# Patient Record
Sex: Male | Born: 1960 | Race: White | Hispanic: No | Smoking: Current every day smoker
Health system: Southern US, Community
[De-identification: ages and names within clinical notes are randomized; demographics above are authoritative.]

## PROBLEM LIST (undated history)

## (undated) DIAGNOSIS — M549 Dorsalgia, unspecified: Secondary | ICD-10-CM

## (undated) DIAGNOSIS — K219 Gastro-esophageal reflux disease without esophagitis: Secondary | ICD-10-CM

## (undated) DIAGNOSIS — E785 Hyperlipidemia, unspecified: Secondary | ICD-10-CM

## (undated) DIAGNOSIS — E119 Type 2 diabetes mellitus without complications: Secondary | ICD-10-CM

## (undated) DIAGNOSIS — I10 Essential (primary) hypertension: Secondary | ICD-10-CM

---

## 1999-03-25 ENCOUNTER — Emergency Department (HOSPITAL_COMMUNITY): Admission: EM | Admit: 1999-03-25 | Discharge: 1999-03-25 | Payer: Self-pay | Admitting: Emergency Medicine

## 1999-03-25 ENCOUNTER — Encounter: Payer: Self-pay | Admitting: Emergency Medicine

## 2001-04-29 ENCOUNTER — Ambulatory Visit (HOSPITAL_COMMUNITY): Admission: RE | Admit: 2001-04-29 | Discharge: 2001-04-29 | Payer: Self-pay | Admitting: Pulmonary Disease

## 2001-05-11 ENCOUNTER — Ambulatory Visit (HOSPITAL_COMMUNITY): Admission: RE | Admit: 2001-05-11 | Discharge: 2001-05-11 | Payer: Self-pay | Admitting: Pulmonary Disease

## 2001-05-20 ENCOUNTER — Emergency Department (HOSPITAL_COMMUNITY): Admission: EM | Admit: 2001-05-20 | Discharge: 2001-05-21 | Payer: Self-pay | Admitting: Emergency Medicine

## 2001-05-21 ENCOUNTER — Encounter: Payer: Self-pay | Admitting: Emergency Medicine

## 2009-10-06 ENCOUNTER — Emergency Department (HOSPITAL_COMMUNITY): Admission: EM | Admit: 2009-10-06 | Discharge: 2009-10-06 | Payer: Self-pay | Admitting: Emergency Medicine

## 2011-04-04 LAB — CBC
HCT: 44.1 % (ref 39.0–52.0)
Hemoglobin: 15.2 g/dL (ref 13.0–17.0)
MCHC: 34.4 g/dL (ref 30.0–36.0)
MCV: 89.4 fL (ref 78.0–100.0)
Platelets: 231 10*3/uL (ref 150–400)
RBC: 4.93 MIL/uL (ref 4.22–5.81)
RDW: 13.9 % (ref 11.5–15.5)
WBC: 11.4 10*3/uL — ABNORMAL HIGH (ref 4.0–10.5)

## 2011-04-04 LAB — DIFFERENTIAL
Basophils Absolute: 0.1 10*3/uL (ref 0.0–0.1)
Basophils Relative: 1 % (ref 0–1)
Lymphocytes Relative: 41 % (ref 12–46)
Monocytes Relative: 8 % (ref 3–12)
Neutro Abs: 5.4 10*3/uL (ref 1.7–7.7)
Neutrophils Relative %: 47 % (ref 43–77)

## 2011-04-04 LAB — BASIC METABOLIC PANEL
BUN: 12 mg/dL (ref 6–23)
CO2: 26 mEq/L (ref 19–32)
Calcium: 9.4 mg/dL (ref 8.4–10.5)
Chloride: 109 mEq/L (ref 96–112)
Creatinine, Ser: 1.11 mg/dL (ref 0.4–1.5)
GFR calc Af Amer: >60 mL/min (ref >60)
GFR calc non Af Amer: >60 mL/min (ref >60)
Glucose, Bld: 111 mg/dL — ABNORMAL HIGH (ref 70–99)
Potassium: 3.5 mEq/L (ref 3.5–5.1)
Sodium: 139 mEq/L (ref 135–145)

## 2011-04-04 LAB — POCT CARDIAC MARKERS
CKMB, poc: <1 ng/mL — ABNORMAL LOW (ref 1.0–8.0)
Myoglobin, poc: 70.8 ng/mL (ref 12–200)
Myoglobin, poc: 77.2 ng/mL (ref 12–200)
Troponin i, poc: <0.05 ng/mL (ref 0.00–0.09)

## 2011-04-08 ENCOUNTER — Ambulatory Visit (HOSPITAL_COMMUNITY)
Admission: RE | Admit: 2011-04-08 | Discharge: 2011-04-08 | Disposition: A | Discharge status: Home / Self Care | Payer: MEDICARE | Source: Ambulatory Visit | Attending: Neurosurgery | Admitting: Neurosurgery

## 2011-04-08 ENCOUNTER — Ambulatory Visit (HOSPITAL_COMMUNITY): Payer: Self-pay | Admitting: Physical Therapy

## 2011-04-08 DIAGNOSIS — M545 Low back pain, unspecified: Secondary | ICD-10-CM | POA: Insufficient documentation

## 2011-04-08 DIAGNOSIS — R269 Unspecified abnormalities of gait and mobility: Secondary | ICD-10-CM | POA: Insufficient documentation

## 2011-04-08 DIAGNOSIS — IMO0001 Reserved for inherently not codable concepts without codable children: Secondary | ICD-10-CM | POA: Insufficient documentation

## 2011-04-08 DIAGNOSIS — M6281 Muscle weakness (generalized): Secondary | ICD-10-CM | POA: Insufficient documentation

## 2011-04-08 DIAGNOSIS — R209 Unspecified disturbances of skin sensation: Secondary | ICD-10-CM | POA: Insufficient documentation

## 2011-04-10 ENCOUNTER — Ambulatory Visit (HOSPITAL_COMMUNITY): Payer: MEDICARE

## 2011-04-15 ENCOUNTER — Ambulatory Visit (HOSPITAL_COMMUNITY)
Admission: RE | Admit: 2011-04-15 | Discharge: 2011-04-15 | Disposition: A | Discharge status: Home / Self Care | Payer: MEDICARE | Source: Ambulatory Visit | Attending: Family Medicine | Admitting: Family Medicine

## 2011-04-17 ENCOUNTER — Ambulatory Visit (HOSPITAL_COMMUNITY): Payer: MEDICARE

## 2011-04-22 ENCOUNTER — Ambulatory Visit (HOSPITAL_COMMUNITY)
Admission: RE | Admit: 2011-04-22 | Discharge: 2011-04-22 | Disposition: A | Discharge status: Home / Self Care | Payer: MEDICARE | Source: Ambulatory Visit | Attending: Family Medicine | Admitting: Family Medicine

## 2011-04-26 ENCOUNTER — Ambulatory Visit (HOSPITAL_COMMUNITY): Payer: MEDICARE

## 2011-04-29 ENCOUNTER — Ambulatory Visit (HOSPITAL_COMMUNITY)
Admission: RE | Admit: 2011-04-29 | Discharge: 2011-04-29 | Disposition: A | Discharge status: Home / Self Care | Payer: MEDICARE | Source: Ambulatory Visit | Attending: Family Medicine | Admitting: Family Medicine

## 2011-05-02 ENCOUNTER — Ambulatory Visit (HOSPITAL_COMMUNITY)
Admission: RE | Admit: 2011-05-02 | Discharge: 2011-05-02 | Disposition: A | Discharge status: Home / Self Care | Payer: MEDICARE | Source: Ambulatory Visit | Attending: Neurosurgery | Admitting: Neurosurgery

## 2011-05-02 DIAGNOSIS — IMO0001 Reserved for inherently not codable concepts without codable children: Secondary | ICD-10-CM | POA: Insufficient documentation

## 2011-05-02 DIAGNOSIS — M545 Low back pain, unspecified: Secondary | ICD-10-CM | POA: Insufficient documentation

## 2011-05-02 DIAGNOSIS — R209 Unspecified disturbances of skin sensation: Secondary | ICD-10-CM | POA: Insufficient documentation

## 2011-05-02 DIAGNOSIS — R269 Unspecified abnormalities of gait and mobility: Secondary | ICD-10-CM | POA: Insufficient documentation

## 2011-05-02 DIAGNOSIS — M6281 Muscle weakness (generalized): Secondary | ICD-10-CM | POA: Insufficient documentation

## 2011-05-06 ENCOUNTER — Ambulatory Visit (HOSPITAL_COMMUNITY)
Admission: RE | Admit: 2011-05-06 | Discharge: 2011-05-06 | Disposition: A | Discharge status: Home / Self Care | Payer: MEDICARE | Source: Ambulatory Visit | Attending: Family Medicine | Admitting: Family Medicine

## 2011-05-09 ENCOUNTER — Ambulatory Visit (HOSPITAL_COMMUNITY)
Admission: RE | Admit: 2011-05-09 | Discharge: 2011-05-09 | Disposition: A | Discharge status: Home / Self Care | Payer: MEDICARE | Source: Ambulatory Visit | Attending: Family Medicine | Admitting: Family Medicine

## 2011-05-13 ENCOUNTER — Ambulatory Visit (HOSPITAL_COMMUNITY)
Admission: RE | Admit: 2011-05-13 | Discharge: 2011-05-13 | Disposition: A | Discharge status: Home / Self Care | Payer: MEDICARE | Source: Ambulatory Visit | Attending: Family Medicine | Admitting: Family Medicine

## 2011-05-16 ENCOUNTER — Ambulatory Visit (HOSPITAL_COMMUNITY)
Admission: RE | Admit: 2011-05-16 | Discharge: 2011-05-16 | Disposition: A | Discharge status: Home / Self Care | Payer: MEDICARE | Source: Ambulatory Visit | Attending: Family Medicine | Admitting: Family Medicine

## 2011-05-20 ENCOUNTER — Ambulatory Visit (HOSPITAL_COMMUNITY)
Admission: RE | Admit: 2011-05-20 | Discharge: 2011-05-20 | Disposition: A | Discharge status: Home / Self Care | Payer: MEDICARE | Source: Ambulatory Visit | Attending: Family Medicine | Admitting: Family Medicine

## 2011-05-22 ENCOUNTER — Ambulatory Visit (HOSPITAL_COMMUNITY): Payer: MEDICARE | Admitting: Physical Therapy

## 2011-05-28 ENCOUNTER — Ambulatory Visit (HOSPITAL_COMMUNITY)
Admission: RE | Admit: 2011-05-28 | Discharge: 2011-05-28 | Disposition: A | Discharge status: Home / Self Care | Payer: MEDICARE | Source: Ambulatory Visit | Attending: Family Medicine | Admitting: Family Medicine

## 2011-05-30 ENCOUNTER — Ambulatory Visit (HOSPITAL_COMMUNITY): Payer: MEDICARE | Admitting: Physical Therapy

## 2011-06-03 ENCOUNTER — Ambulatory Visit (HOSPITAL_COMMUNITY): Payer: Medicare Other

## 2011-06-06 ENCOUNTER — Ambulatory Visit (HOSPITAL_COMMUNITY): Payer: Medicare Other | Admitting: *Deleted

## 2011-06-17 ENCOUNTER — Ambulatory Visit (HOSPITAL_COMMUNITY): Payer: Medicare Other | Admitting: *Deleted

## 2011-06-20 ENCOUNTER — Ambulatory Visit (HOSPITAL_COMMUNITY): Payer: Medicare Other | Admitting: *Deleted

## 2012-07-08 ENCOUNTER — Encounter (INDEPENDENT_AMBULATORY_CARE_PROVIDER_SITE_OTHER): Payer: Self-pay | Admitting: *Deleted

## 2012-07-23 ENCOUNTER — Encounter (INDEPENDENT_AMBULATORY_CARE_PROVIDER_SITE_OTHER): Payer: Medicare Other | Admitting: Internal Medicine

## 2012-07-23 ENCOUNTER — Encounter (INDEPENDENT_AMBULATORY_CARE_PROVIDER_SITE_OTHER): Payer: Self-pay | Admitting: Internal Medicine

## 2012-07-23 VITALS — BP 110/70 | HR 80 | Temp 98.2°F | Ht 71.0 in | Wt 214.3 lb

## 2012-07-23 DIAGNOSIS — K219 Gastro-esophageal reflux disease without esophagitis: Secondary | ICD-10-CM | POA: Insufficient documentation

## 2012-07-23 NOTE — Progress Notes (Signed)
Subjective:     Patient ID: Derek Hanson, male   DOB: 1961-07-10, 51 y.o.   MRN: [id number]  HPI Referred   Review of Systems     Objective:   Physical Exam     Assessment:        Plan:           This encounter was created in error - please disregard.

## 2012-08-09 ENCOUNTER — Emergency Department (HOSPITAL_COMMUNITY): Payer: Medicare Other

## 2012-08-09 ENCOUNTER — Inpatient Hospital Stay (HOSPITAL_COMMUNITY)
Admission: EM | Admit: 2012-08-09 | Discharge: 2012-08-09 | DRG: 065 | Discharge status: Against Medical Advice | Payer: Medicare Other | Attending: Emergency Medicine | Admitting: Emergency Medicine

## 2012-08-09 ENCOUNTER — Encounter (HOSPITAL_COMMUNITY): Payer: Self-pay | Admitting: *Deleted

## 2012-08-09 DIAGNOSIS — G8190 Hemiplegia, unspecified affecting unspecified side: Secondary | ICD-10-CM | POA: Diagnosis present

## 2012-08-09 DIAGNOSIS — K219 Gastro-esophageal reflux disease without esophagitis: Secondary | ICD-10-CM | POA: Diagnosis present

## 2012-08-09 DIAGNOSIS — I6350 Cerebral infarction due to unspecified occlusion or stenosis of unspecified cerebral artery: Principal | ICD-10-CM | POA: Diagnosis present

## 2012-08-09 DIAGNOSIS — Z79899 Other long term (current) drug therapy: Secondary | ICD-10-CM

## 2012-08-09 DIAGNOSIS — I639 Cerebral infarction, unspecified: Secondary | ICD-10-CM

## 2012-08-09 DIAGNOSIS — R079 Chest pain, unspecified: Secondary | ICD-10-CM | POA: Diagnosis present

## 2012-08-09 DIAGNOSIS — F17200 Nicotine dependence, unspecified, uncomplicated: Secondary | ICD-10-CM | POA: Diagnosis present

## 2012-08-09 HISTORY — DX: Gastro-esophageal reflux disease without esophagitis: K21.9

## 2012-08-09 HISTORY — DX: Dorsalgia, unspecified: M54.9

## 2012-08-09 LAB — COMPREHENSIVE METABOLIC PANEL
ALT: 14 U/L (ref 0–53)
AST: 14 U/L (ref 0–37)
Albumin: 3.6 g/dL (ref 3.5–5.2)
CO2: 24 mEq/L (ref 19–32)
Chloride: 102 mEq/L (ref 96–112)
GFR calc non Af Amer: 75 mL/min — ABNORMAL LOW (ref >90)
Potassium: 3.3 mEq/L — ABNORMAL LOW (ref 3.5–5.1)
Sodium: 137 mEq/L (ref 135–145)
Total Bilirubin: 0.2 mg/dL — ABNORMAL LOW (ref 0.3–1.2)

## 2012-08-09 LAB — TROPONIN I: Troponin I: <0.3 ng/mL (ref <0.30)

## 2012-08-09 LAB — CBC WITH DIFFERENTIAL/PLATELET
Basophils Absolute: 0.1 10*3/uL (ref 0.0–0.1)
Basophils Relative: 1 % (ref 0–1)
Hemoglobin: 14.3 g/dL (ref 13.0–17.0)
Lymphocytes Relative: 40 % (ref 12–46)
MCHC: 33.6 g/dL (ref 30.0–36.0)
MCV: 89.3 fL (ref 78.0–100.0)
Monocytes Absolute: 0.6 10*3/uL (ref 0.1–1.0)
Neutro Abs: 4.7 10*3/uL (ref 1.7–7.7)
Neutrophils Relative %: 49 % (ref 43–77)
Platelets: 231 10*3/uL (ref 150–400)
RBC: 4.77 MIL/uL (ref 4.22–5.81)
RDW: 14.1 % (ref 11.5–15.5)

## 2012-08-09 LAB — PROTIME-INR: INR: 0.85 (ref 0.00–1.49)

## 2012-08-09 LAB — GLUCOSE, CAPILLARY: Glucose-Capillary: 103 mg/dL — ABNORMAL HIGH (ref 70–99)

## 2012-08-09 MED ORDER — ASPIRIN 81 MG PO CHEW
324.0000 mg | CHEWABLE_TABLET | Freq: Once | ORAL | Status: AC
  Administered 2012-08-09: 324 mg via ORAL
  Filled 2012-08-09: qty 4

## 2012-08-09 MED ORDER — PANTOPRAZOLE SODIUM 40 MG PO TBEC
80.0000 mg | DELAYED_RELEASE_TABLET | Freq: Every day | ORAL | Status: DC
  Filled 2012-08-09: qty 2

## 2012-08-09 MED ORDER — NITROGLYCERIN 0.4 MG SL SUBL
0.4000 mg | SUBLINGUAL_TABLET | Freq: Once | SUBLINGUAL | Status: AC
  Administered 2012-08-09: 0.4 mg via SUBLINGUAL
  Filled 2012-08-09: qty 75

## 2012-08-09 MED ORDER — NITROGLYCERIN 0.4 MG SL SUBL: 0.4000 mg | SUBLINGUAL_TABLET | SUBLINGUAL | Status: DC | PRN

## 2012-08-09 MED ORDER — ONDANSETRON HCL 4 MG/2ML IJ SOLN
4.0000 mg | Freq: Once | INTRAMUSCULAR | Status: AC
  Administered 2012-08-09: 4 mg via INTRAVENOUS
  Filled 2012-08-09: qty 2

## 2012-08-09 NOTE — ED Notes (Signed)
Family at bedside. 

## 2012-08-09 NOTE — ED Notes (Signed)
Dr Preston Fleeting went in to see patient. Patient then left without signing AMA forms. Pt was told to sign AMA forms prior to leaving but he walked through the back without seeing the RN first.

## 2012-08-09 NOTE — ED Notes (Signed)
RN at bedside

## 2012-08-09 NOTE — ED Provider Notes (Addendum)
History   This chart was scribed for Dione Booze, MD scribed by Magnus Sinning. The patient was seen in room [facility] at 16:30   CSN: [id number]  Arrival date & time 08/09/12  1545   First MD Initiated Contact with Patient 08/09/12 1628      Chief Complaint  Patient presents with  . Chest Pain  . Weakness    (Consider location/radiation/quality/duration/timing/severity/associated sxs/prior treatment) HPI Derek Hanson is a 51 y.o. male who presents to the Emergency Department complaining of constant moderate right sided arm numbness and tingling, onset yesterday with associated right leg swelling, and right leg numbness, both onset approximately 40 minutes ago today. Patient also presents for eval of pressure like chest pain that began yesterday also. He rates a 8/10 for chest pain and denies any aggravating or modifying factors. Says he has had difficulty ambulating, fatigue, diaphoresis, SOB, weakness, and constant nausea that began today. Denies HA, consumption of etoh, but says he currently smokes 1 pack a day. Patient has hx of GERD treated with Nexium, and back pain.   Past Medical History  Diagnosis Date  . Back pain   . GERD (gastroesophageal reflux disease)     History reviewed. No pertinent past surgical history.  No family history on file.  History  Substance Use Topics  . Smoking status: Current Everyday Smoker  . Smokeless tobacco: Not on file   Comment: 1 pack a day.  . Alcohol Use: No      Review of Systems  All other systems reviewed and are negative.    Allergies  Review of patient's allergies indicates no known allergies.  Home Medications   Current Outpatient Rx  Name Route Sig Dispense Refill  . ESOMEPRAZOLE MAGNESIUM 40 MG PO CPDR Oral Take 40 mg by mouth daily before breakfast.      BP 123/76  Pulse 86  Temp 98 F (36.7 C) (Oral)  Resp 18  SpO2 95%  Physical Exam  Nursing note and vitals reviewed. Constitutional: He is  oriented to person, place, and time. He appears well-developed and well-nourished. No distress.  HENT:  Head: Normocephalic and atraumatic.  Eyes: Conjunctivae and EOM are normal.       Fundi are normal  Neck: Neck supple. No tracheal deviation present.       No carotid bruits  Cardiovascular: Normal rate.   Pulmonary/Chest: Effort normal. No respiratory distress.  Abdominal: He exhibits no distension.  Musculoskeletal: Normal range of motion.  Neurological: He is alert and oriented to person, place, and time. No sensory deficit.       Right hemiparesis, arm more involved than leg. Arm strength 3.5/5. Leg strength 4/5.  Decreased sensation of right arm and right leg  Skin: Skin is dry.  Psychiatric: He has a normal mood and affect. His behavior is normal.    ED Course  Procedures (including critical care time) DIAGNOSTIC STUDIES: Oxygen Saturation is 95% on room air, adequate by my interpretation.    COORDINATION OF CARE:  Results for orders placed during the hospital encounter of 08/09/12  CBC WITH DIFFERENTIAL      Component Value Range   WBC 9.5  4.0 - 10.5 K/uL   RBC 4.77  4.22 - 5.81 MIL/uL   Hemoglobin 14.3  13.0 - 17.0 g/dL   HCT 40.3  47.4 - 25.9 %   MCV 89.3  78.0 - 100.0 fL   MCH 30.0  26.0 - 34.0 pg   MCHC 33.6  30.0 - 36.0  g/dL   RDW 62.9  52.8 - 41.3 %   Platelets 231  150 - 400 K/uL   Neutrophils Relative 49  43 - 77 %   Neutro Abs 4.7  1.7 - 7.7 K/uL   Lymphocytes Relative 40  12 - 46 %   Lymphs Abs 3.8  0.7 - 4.0 K/uL   Monocytes Relative 7  3 - 12 %   Monocytes Absolute 0.6  0.1 - 1.0 K/uL   Eosinophils Relative 4  0 - 5 %   Eosinophils Absolute 0.3  0.0 - 0.7 K/uL   Basophils Relative 1  0 - 1 %   Basophils Absolute 0.1  0.0 - 0.1 K/uL  COMPREHENSIVE METABOLIC PANEL      Component Value Range   Sodium 137  135 - 145 mEq/L   Potassium 3.3 (*) 3.5 - 5.1 mEq/L   Chloride 102  96 - 112 mEq/L   CO2 24  19 - 32 mEq/L   Glucose, Bld 125 (*) 70 - 99 mg/dL    BUN 10  6 - 23 mg/dL   Creatinine, Ser 2.44  0.50 - 1.35 mg/dL   Calcium 9.4  8.4 - 01.0 mg/dL   Total Protein 7.3  6.0 - 8.3 g/dL   Albumin 3.6  3.5 - 5.2 g/dL   AST 14  0 - 37 U/L   ALT 14  0 - 53 U/L   Alkaline Phosphatase 66  39 - 117 U/L   Total Bilirubin 0.2 (*) 0.3 - 1.2 mg/dL   GFR calc non Af Amer 75 (*) >90 mL/min   GFR calc Af Amer 87 (*) >90 mL/min  PROTIME-INR      Component Value Range   Prothrombin Time 11.8  11.6 - 15.2 seconds   INR 0.85  0.00 - 1.49  APTT      Component Value Range   aPTT 28  24 - 37 seconds  ETHANOL      Component Value Range   Alcohol, Ethyl (B) <11  0 - 11 mg/dL  TROPONIN I      Component Value Range   Troponin I <0.30  <0.30 ng/mL  GLUCOSE, CAPILLARY      Component Value Range   Glucose-Capillary 103 (*) 70 - 99 mg/dL   Dg Chest 1 View  2/72/5366  *RADIOLOGY REPORT*  Clinical Data: Pain, weakness  CHEST - 1 VIEW  Comparison: 10/06/2009  Findings: Coarse bibasilar interstitial markings as before.  No confluent airspace infiltrate or overt edema.  No effusion.  Heart size upper limits normal for technique.  Regional bones unremarkable.  IMPRESSION:  No acute disease  Original Report Authenticated By: Osa Craver, M.D.   Ct Head Wo Contrast  08/09/2012  *RADIOLOGY REPORT*  Clinical Data: Chest pain, weakness, right arm pain  CT HEAD WITHOUT CONTRAST  Technique:  Contiguous axial images were obtained from the base of the skull through the vertex without contrast.  Comparison: 05/21/2001 by report only  Findings: There is extensive mucoperiosteal thickening in the visualized portions of right maxillary sinus.  Mild atrophy.  Early nonspecific periventricular white matter hypoattenuation most evident in the frontal lobes. There is no evidence of acute intracranial hemorrhage, brain edema, mass lesion, acute infarction,   mass effect, or midline shift. Acute infarct may be inapparent on noncontrast CT.  No other intra-axial abnormalities  are seen, and the ventricles and sulci are within normal limits in size and symmetry.   No abnormal extra-axial fluid collections  or masses are identified.  No significant calvarial abnormality.  IMPRESSION: 1. Negative for bleed or other acute intracranial process. 2.  Right maxillary sinus disease.  Original Report Authenticated By: Osa Craver, M.D.       Date: 08/09/2012  Rate: 85  Rhythm: normal sinus rhythm  QRS Axis: normal  Intervals: normal  ST/T Wave abnormalities: normal  Conduction Disutrbances:incomplete right bundle branch block  Narrative Interpretation: Incomplete right bundle branch block. When compared with ECG of 10/06/2009, no significant changes are seen.  Old EKG Reviewed: unchanged    1. Stroke   2. Chest pain       MDM  Area symptoms started more than 24 hours ago which makes him ineligible for any thrombolytic therapy and not a code stroke. Chest pain which will need to be evaluated with serial cardiac markers. He'll be treated with aspirin and nitroglycerin. CT scan is being obtained as part if his stroke evaluation.  CT does not show any acute changes. Chest pain was relieved after aspirin and nitroglycerin. Case is discussed with Dr. Kerry Hough, who agrees to admit the patient.    I personally performed the services described in this documentation, which was scribed in my presence. The recorded information has been reviewed and considered.          Dione Booze, MD 08/09/12 Berna Spare  Dione Booze, MD 08/09/12 1834  After admission had been arranged, patient stated he did not want to stay and wanted to leave. Risk of leaving AMA were discussed, including risk of death, heart attack, new stroke, paralysis. He expressed understanding, and still wanted to leave AMA. He was given AMA instructions, as well as a prescription for Nitroglycerin tablets, he was advised to take Aspirin every day.  Dione Booze, MD 08/09/12 Ernestina Columbia

## 2012-08-09 NOTE — ED Notes (Signed)
Pt left side chest pain that started yesterday, pain is located in center of chest and goes to left side of chest, pain is associated with sob, nausea, dizziness, lightheadedness. Pt also c/o right side tingling numbness that started 30 minutes ago (3:00 pm) today,

## 2012-08-09 NOTE — ED Notes (Signed)
Pt also did not take any discharge papers prior to his leaving AMA.

## 2012-08-09 NOTE — ED Notes (Signed)
Patient now has room assignment but is requesting to sign out AMA. EDP made aware, going to room to talk to patient.

## 2016-01-10 ENCOUNTER — Encounter: Payer: Self-pay | Admitting: "Endocrinology

## 2016-01-10 ENCOUNTER — Ambulatory Visit (INDEPENDENT_AMBULATORY_CARE_PROVIDER_SITE_OTHER): Payer: PPO | Admitting: "Endocrinology

## 2016-01-10 VITALS — BP 102/69 | HR 72 | Ht 71.0 in | Wt 263.0 lb

## 2016-01-10 DIAGNOSIS — E079 Disorder of thyroid, unspecified: Secondary | ICD-10-CM

## 2016-01-10 DIAGNOSIS — E0590 Thyrotoxicosis, unspecified without thyrotoxic crisis or storm: Secondary | ICD-10-CM | POA: Insufficient documentation

## 2016-01-10 LAB — T4, FREE: Free T4: 0.98 ng/dL (ref 0.80–1.80)

## 2016-01-10 LAB — TSH: TSH: 1.106 u[IU]/mL (ref 0.350–4.500)

## 2016-01-10 NOTE — Progress Notes (Signed)
Subjective:    Patient ID: Derek Hanson, male    DOB: 12-30-1961, PCP Couillard, Lise AuerJennifer L, PA-C.   Past Medical History  Diagnosis Date  . Back pain   . GERD (gastroesophageal reflux disease)    History reviewed. No pertinent past surgical history. Social History   Social History  . Marital Status: Divorced    Spouse Name: N/A  . Number of Children: N/A  . Years of Education: N/A   Social History Main Topics  . Smoking status: Current Every Day Smoker -- 3.00 packs/day    Types: Cigarettes  . Smokeless tobacco: None     Comment: 1 pack a day.  . Alcohol Use: No  . Drug Use: No  . Sexual Activity: Not Asked   Other Topics Concern  . None   Social History Narrative   Outpatient Encounter Prescriptions as of 01/10/2016  Medication Sig  . esomeprazole (NEXIUM) 40 MG capsule Take 40 mg by mouth daily before breakfast.  . nitroGLYCERIN (NITROSTAT) 0.4 MG SL tablet Place 1 tablet (0.4 mg total) under the tongue every 5 (five) minutes as needed for chest pain.   No facility-administered encounter medications on file as of 01/10/2016.   ALLERGIES: No Known Allergies VACCINATION STATUS:  There is no immunization history on file for this patient.  HPI  The patient presents today with a medical history as above, and is being seen in consultation for hyperthyroidism requested by Couillard, Lise AuerJennifer L, PA-C .  The patient has been dealing with symptoms of  weight gain, fatigue, and depression for several months.  -He denies heat intolerance, palpitations, diaphoresis, weight loss.  The patient denies family history of thyroid dysfunction  ,  and the patient denies personal history of goiter. Patient is not on any anti-thyroid medications nor on any thyroid hormone supplements. Patient  is willing to proceed with appropriate work up and therapy for thyrotoxicosis. -During his routine blood work he was found to have slightly suppressed TSH of 0.22 on  12/05/2015.  Review of Systems  Constitutional: + weight gain, + fatigue, no subjective hyperthermia/hypothermia Eyes: no blurry vision, no xerophthalmia ENT: no sore throat, no nodules palpated in throat, no dysphagia/odynophagia, no hoarseness Cardiovascular: no CP/SOB/palpitations/leg swelling Respiratory: + cough/ +SOB Gastrointestinal: no N/V/D/C Musculoskeletal: no muscle/joint aches Skin: no rashes Neurological: no tremors/numbness/tingling/dizziness Psychiatric: no depression/anxiety  Objective:    BP 102/69 mmHg  Pulse 72  Ht 5\' 11"  (1.803 m)  Wt 263 lb (119.296 kg)  BMI 36.70 kg/m2  SpO2 98%  Wt Readings from Last 3 Encounters:  01/10/16 263 lb (119.296 kg)  07/23/12 214 lb 4.8 oz (97.206 kg)    Physical Exam Constitutional: overweight, in NAD Eyes: PERRLA, EOMI, no exophthalmos ENT: moist mucous membranes, thyroid disease palpable with no gross goiter, no cervical lymphadenopathy Cardiovascular: RRR, No MRG Respiratory: Diffuse wheezing on bilateral lung fields. Gastrointestinal: abdomen soft, NT, ND, BS+ Musculoskeletal: no deformities, strength intact in all 4 Skin: moist, warm, no rashes Neurological: no tremor with outstretched hands, DTR normal in all 4   Results for orders placed or performed during the hospital encounter of 08/09/12  CBC with Differential  Result Value Ref Range   WBC 9.5 4.0 - 10.5 K/uL   RBC 4.77 4.22 - 5.81 MIL/uL   Hemoglobin 14.3 13.0 - 17.0 g/dL   HCT 16.142.6 09.639.0 - 04.552.0 %   MCV 89.3 78.0 - 100.0 fL   MCH 30.0 26.0 - 34.0 pg   MCHC 33.6 30.0 -  36.0 g/dL   RDW 45.4 09.8 - 11.9 %   Platelets 231 150 - 400 K/uL   Neutrophils Relative % 49 43 - 77 %   Neutro Abs 4.7 1.7 - 7.7 K/uL   Lymphocytes Relative 40 12 - 46 %   Lymphs Abs 3.8 0.7 - 4.0 K/uL   Monocytes Relative 7 3 - 12 %   Monocytes Absolute 0.6 0.1 - 1.0 K/uL   Eosinophils Relative 4 0 - 5 %   Eosinophils Absolute 0.3 0.0 - 0.7 K/uL   Basophils Relative 1 0 - 1 %    Basophils Absolute 0.1 0.0 - 0.1 K/uL  Comprehensive metabolic panel  Result Value Ref Range   Sodium 137 135 - 145 mEq/L   Potassium 3.3 (L) 3.5 - 5.1 mEq/L   Chloride 102 96 - 112 mEq/L   CO2 24 19 - 32 mEq/L   Glucose, Bld 125 (H) 70 - 99 mg/dL   BUN 10 6 - 23 mg/dL   Creatinine, Ser 1.47 0.50 - 1.35 mg/dL   Calcium 9.4 8.4 - 82.9 mg/dL   Total Protein 7.3 6.0 - 8.3 g/dL   Albumin 3.6 3.5 - 5.2 g/dL   AST 14 0 - 37 U/L   ALT 14 0 - 53 U/L   Alkaline Phosphatase 66 39 - 117 U/L   Total Bilirubin 0.2 (L) 0.3 - 1.2 mg/dL   GFR calc non Af Amer 75 (L) >90 mL/min   GFR calc Af Amer 87 (L) >90 mL/min  Protime-INR  Result Value Ref Range   Prothrombin Time 11.8 11.6 - 15.2 seconds   INR 0.85 0.00 - 1.49  APTT  Result Value Ref Range   aPTT 28 24 - 37 seconds  Ethanol  Result Value Ref Range   Alcohol, Ethyl (B) <11 0 - 11 mg/dL  Troponin I  Result Value Ref Range   Troponin I <0.30 <0.30 ng/mL  Glucose, capillary  Result Value Ref Range   Glucose-Capillary 103 (H) 70 - 99 mg/dL   December 05, 2015 TSH 0.22  Assessment & Plan:   1. Disorder of thyroid gland   Patient's history and most recent labs are reviewed. Findings are not consistent with thyrotoxicosis. This slightly suppressed TSH of 0.22 could be a normal physiologic variation. I like to repeat full profile thyroid function tests today and if  suggestive, he will need confirmatory thyroid uptake and scan. -Therapeutic options will be discussed with him after confirming diagnosis.  The patient will return in 1 week . -I counseled him extensively on smoking cessation. - I advised patient to maintain close follow up with Couillard, Lise Auer, PA-C for primary care needs.  Follow up plan: Return in about 1 week (around 01/17/2016) for overactive thyroid, labs today.  Marquis Lunch, MD Phone: [phone]  Fax: [phone]   01/10/2016, 2:12 PM

## 2016-01-11 LAB — THYROGLOBULIN ANTIBODY: Thyroglobulin Ab: <1 IU/mL (ref <2)

## 2016-01-11 LAB — THYROID PEROXIDASE ANTIBODY: Thyroperoxidase Ab SerPl-aCnc: <1 IU/mL (ref <9)

## 2016-01-22 ENCOUNTER — Encounter: Payer: Self-pay | Admitting: "Endocrinology

## 2016-01-22 ENCOUNTER — Ambulatory Visit (INDEPENDENT_AMBULATORY_CARE_PROVIDER_SITE_OTHER): Payer: PPO | Admitting: "Endocrinology

## 2016-01-22 VITALS — BP 123/78 | HR 92 | Ht 71.0 in | Wt 266.0 lb

## 2016-01-22 DIAGNOSIS — E0590 Thyrotoxicosis, unspecified without thyrotoxic crisis or storm: Secondary | ICD-10-CM | POA: Diagnosis not present

## 2016-01-22 NOTE — Progress Notes (Signed)
Subjective:    Patient ID: Derek Hanson, male    DOB: 1961-11-30, PCP Derek Hanson, Derek Auer, Derek Hanson.   Past Medical History  Diagnosis Date  . Back pain   . GERD (gastroesophageal reflux disease)    History reviewed. No pertinent past surgical history. Social History   Social History  . Marital Status: Divorced    Spouse Name: N/A  . Number of Children: N/A  . Years of Education: N/A   Social History Main Topics  . Smoking status: Current Every Day Smoker -- 3.00 packs/day    Types: Cigarettes  . Smokeless tobacco: None     Comment: 1 pack a day.  . Alcohol Use: No  . Drug Use: No  . Sexual Activity: Not Asked   Other Topics Concern  . None   Social History Narrative   Outpatient Encounter Prescriptions as of 01/22/2016  Medication Sig  . esomeprazole (NEXIUM) 40 MG capsule Take 40 mg by mouth daily before breakfast.  . [DISCONTINUED] nitroGLYCERIN (NITROSTAT) 0.4 MG SL tablet Place 1 tablet (0.4 mg total) under the tongue every 5 (five) minutes as needed for chest pain.   No facility-administered encounter medications on file as of 01/22/2016.   ALLERGIES: No Known Allergies VACCINATION STATUS:  There is no immunization history on file for this patient.  HPI  Derek Hanson is here to follow-up for recent abnormal TSH. He had repeat thyroid function test, which are within normal limits. He has no new complaints.  The patient has been dealing with symptoms of  weight gain, fatigue, and depression for several months.  -He denies heat intolerance, palpitations, diaphoresis, weight loss.  The patient denies family history of thyroid dysfunction  ,  and the patient denies personal history of goiter. Patient is not on any anti-thyroid medications nor on any thyroid hormone supplements.   Review of Systems  Constitutional: + weight gain, + fatigue, no subjective hyperthermia/hypothermia Eyes: no blurry vision, no xerophthalmia ENT: no sore throat, no nodules  palpated in thyroid, no dysphagia/odynophagia, no hoarseness Cardiovascular: no CP/SOB/palpitations/leg swelling Respiratory: + cough/ +SOB Gastrointestinal: no N/V/D/C Musculoskeletal: no muscle/joint aches Skin: no rashes Neurological: no tremors/numbness/tingling/dizziness Psychiatric: no depression/anxiety  Objective:    BP 123/78 mmHg  Pulse 92  Ht  (1.803 m)  Wt 266 lb (120.657 kg)  BMI 37.12 kg/m2  SpO2 97%  Wt Readings from Last 3 Encounters:  01/22/16 266 lb (120.657 kg)  01/10/16 263 lb (119.296 kg)  07/23/12 214 lb 4.8 oz (97.206 kg)    Physical Exam Constitutional: overweight, in NAD Eyes: PERRLA, EOMI, no exophthalmos ENT: moist mucous membranes, thyroid palpable with no gross goiter, no cervical lymphadenopathy Cardiovascular: RRR, No MRG Respiratory: Diffuse wheezing on bilateral lung fields. Gastrointestinal: abdomen soft, NT, ND, BS+ Musculoskeletal: no deformities, strength intact in all 4 Skin: moist, warm, no rashes Neurological: no tremor with outstretched hands, DTR normal in all 4   Results for orders placed or performed in visit on 01/10/16  T4, free  Result Value Ref Range   Free T4 0.98 0.80 - 1.80 ng/dL  TSH  Result Value Ref Range   TSH 1.106 0.350 - 4.500 uIU/mL  Thyroglobulin antibody  Result Value Ref Range   Thyroglobulin Ab <1 <2 IU/mL  Thyroid peroxidase antibody  Result Value Ref Range   Thyroperoxidase Ab SerPl-aCnc <1 <9 IU/mL   December 05, 2015 TSH 0.22  Assessment & Plan:   1. Subclinical hyperthyroidism: His repeat thyroid function tests are within  normal limits. He will not need thyroid intervention at this point. He will need repeat thyroid function test in 1 year. -given his concern about weight gain, I gave him detailed carbohydrates and exercise management advice. -I counseled him extensively on smoking cessation. - I advised patient to maintain close follow up with Derek Hanson, Derek Auer, Derek Hanson for primary care  needs.  Follow up plan: Return in about 1 year (around 01/21/2017) for overactive thyroid, follow up with pre-visit labs.  Marquis Lunch, MD Phone: [phone]  Fax: [phone]   01/22/2016, 9:33 AM

## 2016-01-22 NOTE — Patient Instructions (Signed)

## 2016-03-18 ENCOUNTER — Other Ambulatory Visit: Payer: Self-pay | Admitting: Internal Medicine

## 2016-03-21 ENCOUNTER — Institutional Professional Consult (permissible substitution): Payer: Self-pay | Admitting: Internal Medicine

## 2016-03-25 DIAGNOSIS — H40033 Anatomical narrow angle, bilateral: Secondary | ICD-10-CM | POA: Diagnosis not present

## 2016-03-25 DIAGNOSIS — H1013 Acute atopic conjunctivitis, bilateral: Secondary | ICD-10-CM | POA: Diagnosis not present

## 2016-08-06 DIAGNOSIS — K219 Gastro-esophageal reflux disease without esophagitis: Secondary | ICD-10-CM | POA: Diagnosis not present

## 2016-08-06 DIAGNOSIS — Z716 Tobacco abuse counseling: Secondary | ICD-10-CM | POA: Diagnosis not present

## 2016-08-06 DIAGNOSIS — E6609 Other obesity due to excess calories: Secondary | ICD-10-CM | POA: Diagnosis not present

## 2016-08-09 DIAGNOSIS — R7301 Impaired fasting glucose: Secondary | ICD-10-CM | POA: Diagnosis not present

## 2016-08-09 DIAGNOSIS — K219 Gastro-esophageal reflux disease without esophagitis: Secondary | ICD-10-CM | POA: Diagnosis not present

## 2016-08-20 DIAGNOSIS — D72829 Elevated white blood cell count, unspecified: Secondary | ICD-10-CM | POA: Diagnosis not present

## 2017-01-22 ENCOUNTER — Ambulatory Visit: Payer: PPO | Admitting: "Endocrinology

## 2017-02-06 DIAGNOSIS — K219 Gastro-esophageal reflux disease without esophagitis: Secondary | ICD-10-CM | POA: Diagnosis not present

## 2017-02-06 DIAGNOSIS — E1142 Type 2 diabetes mellitus with diabetic polyneuropathy: Secondary | ICD-10-CM | POA: Diagnosis not present

## 2017-02-06 DIAGNOSIS — E6609 Other obesity due to excess calories: Secondary | ICD-10-CM | POA: Diagnosis not present

## 2017-02-06 DIAGNOSIS — Z6836 Body mass index (BMI) 36.0-36.9, adult: Secondary | ICD-10-CM | POA: Diagnosis not present

## 2017-02-06 DIAGNOSIS — E1165 Type 2 diabetes mellitus with hyperglycemia: Secondary | ICD-10-CM | POA: Diagnosis not present

## 2017-02-26 DIAGNOSIS — M545 Low back pain: Secondary | ICD-10-CM | POA: Diagnosis not present

## 2017-02-26 DIAGNOSIS — M5441 Lumbago with sciatica, right side: Secondary | ICD-10-CM | POA: Diagnosis not present

## 2017-11-26 DIAGNOSIS — R200 Anesthesia of skin: Secondary | ICD-10-CM | POA: Diagnosis not present

## 2017-11-26 DIAGNOSIS — K219 Gastro-esophageal reflux disease without esophagitis: Secondary | ICD-10-CM | POA: Diagnosis not present

## 2017-11-26 DIAGNOSIS — E1165 Type 2 diabetes mellitus with hyperglycemia: Secondary | ICD-10-CM | POA: Diagnosis not present

## 2017-11-27 DIAGNOSIS — I25119 Atherosclerotic heart disease of native coronary artery with unspecified angina pectoris: Secondary | ICD-10-CM | POA: Diagnosis not present

## 2017-11-27 DIAGNOSIS — R200 Anesthesia of skin: Secondary | ICD-10-CM | POA: Diagnosis not present

## 2017-11-27 DIAGNOSIS — R202 Paresthesia of skin: Secondary | ICD-10-CM | POA: Diagnosis not present

## 2017-11-27 DIAGNOSIS — E1142 Type 2 diabetes mellitus with diabetic polyneuropathy: Secondary | ICD-10-CM | POA: Diagnosis not present

## 2017-11-27 DIAGNOSIS — E1165 Type 2 diabetes mellitus with hyperglycemia: Secondary | ICD-10-CM | POA: Diagnosis not present

## 2020-03-27 DIAGNOSIS — R972 Elevated prostate specific antigen [PSA]: Secondary | ICD-10-CM | POA: Diagnosis not present

## 2020-03-27 DIAGNOSIS — I110 Hypertensive heart disease with heart failure: Secondary | ICD-10-CM | POA: Diagnosis not present

## 2020-03-27 DIAGNOSIS — E559 Vitamin D deficiency, unspecified: Secondary | ICD-10-CM | POA: Diagnosis not present

## 2020-03-27 DIAGNOSIS — E1165 Type 2 diabetes mellitus with hyperglycemia: Secondary | ICD-10-CM | POA: Diagnosis not present

## 2020-03-27 DIAGNOSIS — E7849 Other hyperlipidemia: Secondary | ICD-10-CM | POA: Diagnosis not present

## 2020-03-27 DIAGNOSIS — D510 Vitamin B12 deficiency anemia due to intrinsic factor deficiency: Secondary | ICD-10-CM | POA: Diagnosis not present

## 2020-03-27 DIAGNOSIS — R5383 Other fatigue: Secondary | ICD-10-CM | POA: Diagnosis not present

## 2020-04-03 ENCOUNTER — Other Ambulatory Visit: Payer: Self-pay

## 2020-04-03 ENCOUNTER — Other Ambulatory Visit (HOSPITAL_COMMUNITY): Payer: Self-pay | Admitting: Family Medicine

## 2020-04-03 ENCOUNTER — Ambulatory Visit (HOSPITAL_COMMUNITY)
Admission: RE | Admit: 2020-04-03 | Discharge: 2020-04-03 | Disposition: A | Discharge status: Home / Self Care | Payer: PPO | Source: Ambulatory Visit | Attending: Family Medicine | Admitting: Family Medicine

## 2020-04-03 DIAGNOSIS — R52 Pain, unspecified: Secondary | ICD-10-CM | POA: Insufficient documentation

## 2020-04-03 DIAGNOSIS — M545 Low back pain: Secondary | ICD-10-CM | POA: Diagnosis not present

## 2020-04-03 DIAGNOSIS — E7849 Other hyperlipidemia: Secondary | ICD-10-CM | POA: Diagnosis not present

## 2020-04-03 DIAGNOSIS — I119 Hypertensive heart disease without heart failure: Secondary | ICD-10-CM | POA: Diagnosis not present

## 2020-04-03 DIAGNOSIS — E1165 Type 2 diabetes mellitus with hyperglycemia: Secondary | ICD-10-CM | POA: Diagnosis not present

## 2020-04-03 DIAGNOSIS — K219 Gastro-esophageal reflux disease without esophagitis: Secondary | ICD-10-CM | POA: Diagnosis not present

## 2020-04-07 DIAGNOSIS — I110 Hypertensive heart disease with heart failure: Secondary | ICD-10-CM | POA: Diagnosis not present

## 2020-04-07 DIAGNOSIS — E1165 Type 2 diabetes mellitus with hyperglycemia: Secondary | ICD-10-CM | POA: Diagnosis not present

## 2020-04-07 DIAGNOSIS — E7849 Other hyperlipidemia: Secondary | ICD-10-CM | POA: Diagnosis not present

## 2020-04-07 DIAGNOSIS — K219 Gastro-esophageal reflux disease without esophagitis: Secondary | ICD-10-CM | POA: Diagnosis not present

## 2020-04-24 DIAGNOSIS — R5382 Chronic fatigue, unspecified: Secondary | ICD-10-CM | POA: Diagnosis not present

## 2020-04-24 DIAGNOSIS — E1165 Type 2 diabetes mellitus with hyperglycemia: Secondary | ICD-10-CM | POA: Diagnosis not present

## 2020-04-24 DIAGNOSIS — I119 Hypertensive heart disease without heart failure: Secondary | ICD-10-CM | POA: Diagnosis not present

## 2020-04-24 DIAGNOSIS — E7849 Other hyperlipidemia: Secondary | ICD-10-CM | POA: Diagnosis not present

## 2020-05-22 DIAGNOSIS — E1165 Type 2 diabetes mellitus with hyperglycemia: Secondary | ICD-10-CM | POA: Diagnosis not present

## 2020-05-22 DIAGNOSIS — I110 Hypertensive heart disease with heart failure: Secondary | ICD-10-CM | POA: Diagnosis not present

## 2020-05-22 DIAGNOSIS — K219 Gastro-esophageal reflux disease without esophagitis: Secondary | ICD-10-CM | POA: Diagnosis not present

## 2020-06-27 ENCOUNTER — Emergency Department (HOSPITAL_COMMUNITY): Payer: PPO

## 2020-06-27 ENCOUNTER — Encounter (HOSPITAL_COMMUNITY): Payer: Self-pay

## 2020-06-27 ENCOUNTER — Other Ambulatory Visit: Payer: Self-pay

## 2020-06-27 ENCOUNTER — Observation Stay (HOSPITAL_COMMUNITY)
Admission: EM | Admit: 2020-06-27 | Discharge: 2020-06-27 | Discharge status: Against Medical Advice | Payer: PPO | Attending: Internal Medicine | Admitting: Internal Medicine

## 2020-06-27 DIAGNOSIS — E669 Obesity, unspecified: Secondary | ICD-10-CM | POA: Diagnosis not present

## 2020-06-27 DIAGNOSIS — I10 Essential (primary) hypertension: Secondary | ICD-10-CM | POA: Insufficient documentation

## 2020-06-27 DIAGNOSIS — Z6831 Body mass index (BMI) 31.0-31.9, adult: Secondary | ICD-10-CM | POA: Insufficient documentation

## 2020-06-27 DIAGNOSIS — E119 Type 2 diabetes mellitus without complications: Secondary | ICD-10-CM | POA: Insufficient documentation

## 2020-06-27 DIAGNOSIS — I200 Unstable angina: Secondary | ICD-10-CM | POA: Diagnosis not present

## 2020-06-27 DIAGNOSIS — K219 Gastro-esophageal reflux disease without esophagitis: Secondary | ICD-10-CM | POA: Diagnosis not present

## 2020-06-27 DIAGNOSIS — Z794 Long term (current) use of insulin: Secondary | ICD-10-CM | POA: Insufficient documentation

## 2020-06-27 DIAGNOSIS — E7800 Pure hypercholesterolemia, unspecified: Secondary | ICD-10-CM | POA: Diagnosis not present

## 2020-06-27 DIAGNOSIS — R0789 Other chest pain: Secondary | ICD-10-CM | POA: Diagnosis not present

## 2020-06-27 DIAGNOSIS — E785 Hyperlipidemia, unspecified: Secondary | ICD-10-CM | POA: Diagnosis not present

## 2020-06-27 DIAGNOSIS — F17210 Nicotine dependence, cigarettes, uncomplicated: Secondary | ICD-10-CM | POA: Diagnosis not present

## 2020-06-27 DIAGNOSIS — R079 Chest pain, unspecified: Secondary | ICD-10-CM | POA: Diagnosis not present

## 2020-06-27 DIAGNOSIS — Z79899 Other long term (current) drug therapy: Secondary | ICD-10-CM | POA: Insufficient documentation

## 2020-06-27 DIAGNOSIS — R0902 Hypoxemia: Secondary | ICD-10-CM | POA: Diagnosis not present

## 2020-06-27 DIAGNOSIS — Z20822 Contact with and (suspected) exposure to covid-19: Secondary | ICD-10-CM | POA: Diagnosis not present

## 2020-06-27 DIAGNOSIS — R05 Cough: Secondary | ICD-10-CM | POA: Diagnosis not present

## 2020-06-27 HISTORY — DX: Hyperlipidemia, unspecified: E78.5

## 2020-06-27 HISTORY — DX: Type 2 diabetes mellitus without complications: E11.9

## 2020-06-27 HISTORY — DX: Essential (primary) hypertension: I10

## 2020-06-27 LAB — CBC
HCT: 39.3 % (ref 39.0–52.0)
Hemoglobin: 12.7 g/dL — ABNORMAL LOW (ref 13.0–17.0)
MCH: 29.7 pg (ref 26.0–34.0)
MCHC: 32.3 g/dL (ref 30.0–36.0)
MCV: 91.8 fL (ref 80.0–100.0)
Platelets: 292 10*3/uL (ref 150–400)
RBC: 4.28 MIL/uL (ref 4.22–5.81)
RDW: 13.6 % (ref 11.5–15.5)
WBC: 9.6 10*3/uL (ref 4.0–10.5)
nRBC: 0 % (ref 0.0–0.2)

## 2020-06-27 LAB — BASIC METABOLIC PANEL
Anion gap: 10 (ref 5–15)
BUN: 10 mg/dL (ref 6–20)
CO2: 23 mmol/L (ref 22–32)
Calcium: 8.7 mg/dL — ABNORMAL LOW (ref 8.9–10.3)
Chloride: 107 mmol/L (ref 98–111)
Creatinine, Ser: 0.9 mg/dL (ref 0.61–1.24)
GFR calc Af Amer: >60 mL/min (ref >60)
GFR calc non Af Amer: >60 mL/min (ref >60)
Glucose, Bld: 102 mg/dL — ABNORMAL HIGH (ref 70–99)
Potassium: 3.6 mmol/L (ref 3.5–5.1)
Sodium: 140 mmol/L (ref 135–145)

## 2020-06-27 LAB — TROPONIN I (HIGH SENSITIVITY)
Troponin I (High Sensitivity): 2 ng/L (ref <18)
Troponin I (High Sensitivity): 2 ng/L (ref <18)

## 2020-06-27 LAB — SARS CORONAVIRUS 2 BY RT PCR (HOSPITAL ORDER, PERFORMED IN ~~LOC~~ HOSPITAL LAB): SARS Coronavirus 2: NEGATIVE

## 2020-06-27 MED ORDER — ASPIRIN 81 MG PO CHEW
324.0000 mg | CHEWABLE_TABLET | Freq: Once | ORAL | Status: AC
  Administered 2020-06-27: 324 mg via ORAL
  Filled 2020-06-27: qty 4

## 2020-06-27 NOTE — ED Notes (Signed)
Hospitalist at bedside 

## 2020-06-27 NOTE — Progress Notes (Signed)
I was called  By EDP to admit Mr. Derek Hanson, 59 year old male with history of hypertension and diabetes and tobacco abuse.  He presented to the ED with chest pains, concerning for ACS.  At the time of my evaluation, patient was sitting up in bed with his legs over the side, he told me at least 2ce that he did not want to be admitted, he wanted to go home.  Fianc at bedside.  Patient is awake alert oriented, to person place and situation.  He was not fully cooperative with history and examination.  I explained to patient why he needed to be admitted.  I told him right now his EKG and troponins are normal, but this could change over the course of the night, if his chest pains are from heart attack, and he goes home, it could be fatal for him.  I repeated this explanation.  He nodded understanding.  I asked what else we could do to make his stay comfortable, but he refuses to say.  I told fianc at bedside to talk to patient, but she noncommittal tells me patient is hardheaded.  A few minutes after I left the room, patient walked out, signed AMA papers and left.   Inocente Salles, MD. [facility]. 06/27/20 8:41 PM   LOS  - NO CHARGE.

## 2020-06-27 NOTE — ED Notes (Signed)
Pts fiance came out of room stating he has pulled everything out and he is leaving. Paged DR Tommie Raymond. Pt walking by nursing desk with IV still in arm. Pt had to be stopped so we could remove IV.  Pt signed ama and was informed that hospital was not liable for anything that happened to pt once he left

## 2020-06-27 NOTE — ED Triage Notes (Signed)
Pt brought in by EMS. 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 PT was hot, diaphoretic and complaining of CP. Pain with palpation. Cough up clear mucous. Pain started suddenly at 0900. Pt reported numbness in left hand. Very anxious at time . CBG 126. Pt started on 3L [place] due to SOB. Pt smokes 4 PPD. Pt given 1 nitro . IV established

## 2020-06-27 NOTE — ED Provider Notes (Signed)
[facility] EMERGENCY DEPARTMENT Provider Note   CSN: [id number] Arrival date & time: 06/27/20  1125     History Chief Complaint  Patient presents with  . Chest Pain    Derek Hanson is a 59 y.o. 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  He endorses nausea, headache and left arm numbness which occurred concurrently with the chest pain symptoms.  He denies having shortness of breath.  His pain was described as a pressure sensation.  He arrives by EMS, was given nitroglycerin x1 prior to arrival which completely eliminated his chest pain.  He also denies continued nausea or headache at this time, but still endorses mild tingling in his left arm.  Wife at bedside confirms he has complaints of chest pain intermittently, most recently several days ago while he was tilling his garden.  He states it got better when he rested.  He is a heavy tobacco abuser.  Denies leg pain or swelling, denies palpitations.  The history is provided by the patient.    HPI: A 59 year old patient with a history of treated diabetes, hypertension, hypercholesterolemia and obesity presents for evaluation of chest pain. Initial onset of pain was approximately 3-6 hours ago. The patient's chest pain is described as heaviness/pressure/tightness, is not worse with exertion and is relieved by nitroglycerin. The patient complains of nausea. The patient's chest pain is middle- or left-sided, is not well-localized, is not sharp and does radiate to the arms/jaw/neck. The patient denies diaphoresis. The patient has smoked in the past 90 days. The patient has no history of stroke, has no history of peripheral artery disease and has no relevant family history of coronary artery disease (first degree relative at less than age 71).   Past Medical History:  Diagnosis Date  . Back pain   . Diabetes mellitus without  complication (HCC)   . GERD (gastroesophageal reflux disease)   . Hyperlipidemia   . Hypertension     Patient Active Problem List   Diagnosis Date Noted  . Chest pain 06/27/2020  . Subclinical hyperthyroidism 01/10/2016  . GERD (gastroesophageal reflux disease) 07/23/2012    History reviewed. No pertinent surgical history.     No family history on file.  Social History   Tobacco Use  . Smoking status: Current Every Day Smoker    Packs/day: 4.00    Types: Cigarettes  . Tobacco comment: 1 pack a day.  Substance Use Topics  . Alcohol use: No  . Drug use: No    Home Medications Prior to Admission medications   Medication Sig Start Date End Date Taking? Authorizing Provider  amLODipine (NORVASC) 5 MG tablet Take 5 mg by mouth daily. 03/27/20  Yes [provider]  LANTUS SOLOSTAR 100 UNIT/ML Solostar Pen Inject 20 Units into the skin daily. 06/19/20  Yes [provider]  naproxen (NAPROSYN) 500 MG tablet Take 500 mg by mouth 2 (two) times daily. 06/18/20  Yes [provider]  pantoprazole (PROTONIX) 40 MG tablet Take 40 mg by mouth daily. 05/22/20  Yes [provider]  pravastatin (PRAVACHOL) 40 MG tablet Take 40 mg by mouth daily. 04/25/20  Yes [provider]    Allergies    Patient has no known allergies.  Review of Systems   Review of Systems  Constitutional: Negative for chills and fever.  HENT: Negative for congestion and sore throat.   Eyes: Negative.  Respiratory: Negative for chest tightness and shortness of breath.   Cardiovascular: Positive for chest pain. Negative for palpitations and leg swelling.  Gastrointestinal: Positive for nausea. Negative for abdominal pain and vomiting.  Genitourinary: Negative.   Musculoskeletal: Negative for arthralgias, joint swelling and neck pain.  Skin: Negative.  Negative for rash and wound.  Neurological: Positive for numbness and headaches. Negative for dizziness, weakness and  light-headedness.  Psychiatric/Behavioral: Negative.   All other systems reviewed and are negative.   Physical Exam Updated Vital Signs BP 116/72   Pulse (!) 56   Temp 97.6 F (36.4 C) (Oral)   Resp 15   Ht 5\' 10"  (1.778 m)   Wt 99.8 kg   SpO2 90%   BMI 31.57 kg/m   Physical Exam Vitals and nursing note reviewed.  Constitutional:      Appearance: He is well-developed.  HENT:     Head: Normocephalic and atraumatic.  Eyes:     Conjunctiva/sclera: Conjunctivae normal.  Cardiovascular:     Rate and Rhythm: Normal rate and regular rhythm.     Heart sounds: Normal heart sounds.  Pulmonary:     Effort: Pulmonary effort is normal. No tachypnea.     Breath sounds: Normal breath sounds. No wheezing, rhonchi or rales.  Chest:     Chest wall: No tenderness.  Abdominal:     General: Bowel sounds are normal.     Palpations: Abdomen is soft.     Tenderness: There is no abdominal tenderness.  Musculoskeletal:        General: Normal range of motion.     Cervical back: Normal range of motion.     Right lower leg: No tenderness. No edema.     Left lower leg: No tenderness. No edema.  Skin:    General: Skin is warm and dry.  Neurological:     Mental Status: He is alert.     ED Results / Procedures / Treatments   Labs (all labs ordered are listed, but only abnormal results are displayed) Labs Reviewed  BASIC METABOLIC PANEL - Abnormal; Notable for the following components:      Result Value   Glucose, Bld 102 (*)    Calcium 8.7 (*)    All other components within normal limits  CBC - Abnormal; Notable for the following components:   Hemoglobin 12.7 (*)    All other components within normal limits  SARS CORONAVIRUS 2 BY RT PCR (HOSPITAL ORDER, PERFORMED IN [facility] LAB)  TROPONIN I (HIGH SENSITIVITY)  TROPONIN I (HIGH SENSITIVITY)    EKG EKG Interpretation  Date/Time:  Tuesday June 27 2020 11:49:56 EDT Ventricular Rate:  80 PR Interval:    QRS  Duration: 104 QT Interval:  405 QTC Calculation: 468 R Axis:   26 Text Interpretation: Sinus rhythm Minimal ST elevation, inferior leads No STEMI Confirmed by [id number] [phone]) on 06/27/2020 12:52:21 PM   Radiology DG Chest 2 View  Result Date: 06/27/2020 CLINICAL DATA:  Chest pain. EXAM: CHEST - 2 VIEW COMPARISON:  08/09/2012. FINDINGS: Mediastinum and hilar structures normal. Heart size normal. No focal infiltrate. No pleural effusion or pneumothorax. Biapical pleural thickening consistent with scarring. IMPRESSION: No acute cardiopulmonary disease. Electronically Signed   By: 10/09/2012  Register   On: 06/27/2020 12:39    Procedures Procedures (including critical care time)  Medications Ordered in ED Medications  aspirin chewable tablet 324 mg (324 mg Oral Given 06/27/20 1403)    ED Course  I have reviewed the  triage vital signs and the nursing notes.  Pertinent labs & imaging results that were available during my care of the patient were reviewed by me and considered in my medical decision making (see chart for details).  Clinical Course as of Jun 27 1717  Tue Jun 27, 2020  [id number] 59 year old male with a history of 3 to 4 pack a day smoking, hypertension, diabetes, no known or reported cardiac history, presenting to the ED with chest pressure.  Patient reports a pressure began left of his chest yesterday while working outside and doing physical work in the yard.  The pressure resumed again this morning while at rest.  He called EMS who administered nitroglycerin, reports immediate relief of his pressure.  He is currently pain-free.  He has an overall benign cardiopulmonary exam and appears comfortable.  Her troponin is 2.  His EKG shows a sinus rhythm without obvious ischemic findings.  However, story is still extremely concerning for unstable angina or coronary cause of his chest pain.  Second Theodis Aguas is pending.  Will discuss with cardiology and anticipate admission for stress test.    [MT]    Clinical Course User Index [MT] Trifan, Kermit Balo, MD   MDM Rules/Calculators/A&P HEAR Score: 6                        Labs and imaging reviewed and discussed with patient.  He has a negative delta troponin but a very high heart score of 6.  His history is very concerning for unstable angina.  This patient will be best served by admission and probable stress testing prior to discharge home.  He is currently symptom-free and has been symptom-free since receiving nitroglycerin prior to arrival.  Discussed with Dr. Wyline Mood who agrees with need for medical admission, the cardiology team will consult in the morning and consider stress testing versus possible catheterization.  He has requested that patient remain n.p.o. after midnight tonight in the event they take him to the Cath Lab.  He can stay here tonight since he is symptom-free.  Consult to hospitalist placed.  Discussed with Dr. Mariea Clonts who accepts pt for admission. Final Clinical Impression(s) / ED Diagnoses Final diagnoses:  Unstable angina [facility])    Rx / DC Orders ED Discharge Orders    None       Victoriano Lain 06/27/20 1720    Terald Sleeper, MD 06/27/20 Jerene Bears

## 2020-07-18 DIAGNOSIS — I119 Hypertensive heart disease without heart failure: Secondary | ICD-10-CM | POA: Diagnosis not present

## 2020-07-18 DIAGNOSIS — K219 Gastro-esophageal reflux disease without esophagitis: Secondary | ICD-10-CM | POA: Diagnosis not present

## 2020-07-18 DIAGNOSIS — E7849 Other hyperlipidemia: Secondary | ICD-10-CM | POA: Diagnosis not present

## 2020-07-18 DIAGNOSIS — E1165 Type 2 diabetes mellitus with hyperglycemia: Secondary | ICD-10-CM | POA: Diagnosis not present

## 2020-07-26 DIAGNOSIS — I200 Unstable angina: Secondary | ICD-10-CM | POA: Diagnosis not present

## 2020-08-18 DIAGNOSIS — E7849 Other hyperlipidemia: Secondary | ICD-10-CM | POA: Diagnosis not present

## 2020-08-18 DIAGNOSIS — J449 Chronic obstructive pulmonary disease, unspecified: Secondary | ICD-10-CM | POA: Diagnosis not present

## 2020-08-18 DIAGNOSIS — R05 Cough: Secondary | ICD-10-CM | POA: Diagnosis not present

## 2020-08-18 DIAGNOSIS — E1165 Type 2 diabetes mellitus with hyperglycemia: Secondary | ICD-10-CM | POA: Diagnosis not present

## 2020-09-04 ENCOUNTER — Other Ambulatory Visit: Payer: Self-pay

## 2020-09-04 ENCOUNTER — Emergency Department (HOSPITAL_COMMUNITY)
Admission: EM | Admit: 2020-09-04 | Discharge: 2020-09-04 | Disposition: A | Discharge status: Home / Self Care | Payer: PPO

## 2020-09-04 ENCOUNTER — Encounter (HOSPITAL_COMMUNITY): Payer: Self-pay | Admitting: Emergency Medicine

## 2020-09-04 NOTE — ED Triage Notes (Signed)
Pt reports he wants a COVID test; denies exposure

## 2020-09-06 DIAGNOSIS — R0602 Shortness of breath: Secondary | ICD-10-CM | POA: Diagnosis not present

## 2020-09-06 DIAGNOSIS — E1165 Type 2 diabetes mellitus with hyperglycemia: Secondary | ICD-10-CM | POA: Diagnosis not present

## 2020-09-06 DIAGNOSIS — R5382 Chronic fatigue, unspecified: Secondary | ICD-10-CM | POA: Diagnosis not present

## 2020-09-20 DIAGNOSIS — E1165 Type 2 diabetes mellitus with hyperglycemia: Secondary | ICD-10-CM | POA: Diagnosis not present

## 2020-09-20 DIAGNOSIS — I110 Hypertensive heart disease with heart failure: Secondary | ICD-10-CM | POA: Diagnosis not present

## 2020-09-20 DIAGNOSIS — K219 Gastro-esophageal reflux disease without esophagitis: Secondary | ICD-10-CM | POA: Diagnosis not present

## 2020-11-01 DIAGNOSIS — E1165 Type 2 diabetes mellitus with hyperglycemia: Secondary | ICD-10-CM | POA: Diagnosis not present

## 2020-11-01 DIAGNOSIS — I110 Hypertensive heart disease with heart failure: Secondary | ICD-10-CM | POA: Diagnosis not present

## 2020-11-01 DIAGNOSIS — K219 Gastro-esophageal reflux disease without esophagitis: Secondary | ICD-10-CM | POA: Diagnosis not present

## 2020-11-15 ENCOUNTER — Other Ambulatory Visit (HOSPITAL_COMMUNITY): Payer: Self-pay | Admitting: Family Medicine

## 2020-11-15 ENCOUNTER — Other Ambulatory Visit: Payer: Self-pay | Admitting: Family Medicine

## 2020-11-15 DIAGNOSIS — M545 Low back pain, unspecified: Secondary | ICD-10-CM

## 2020-11-27 ENCOUNTER — Other Ambulatory Visit: Payer: Self-pay

## 2020-11-27 ENCOUNTER — Ambulatory Visit (HOSPITAL_COMMUNITY)
Admission: RE | Admit: 2020-11-27 | Discharge: 2020-11-27 | Disposition: A | Discharge status: Home / Self Care | Payer: PPO | Source: Ambulatory Visit | Attending: Family Medicine | Admitting: Family Medicine

## 2020-11-27 DIAGNOSIS — M545 Low back pain, unspecified: Secondary | ICD-10-CM | POA: Diagnosis not present

## 2020-12-04 DIAGNOSIS — G894 Chronic pain syndrome: Secondary | ICD-10-CM | POA: Diagnosis not present

## 2020-12-04 DIAGNOSIS — K219 Gastro-esophageal reflux disease without esophagitis: Secondary | ICD-10-CM | POA: Diagnosis not present

## 2020-12-04 DIAGNOSIS — M5459 Other low back pain: Secondary | ICD-10-CM | POA: Diagnosis not present

## 2021-01-02 DIAGNOSIS — M2550 Pain in unspecified joint: Secondary | ICD-10-CM | POA: Diagnosis not present

## 2021-01-02 DIAGNOSIS — M5459 Other low back pain: Secondary | ICD-10-CM | POA: Diagnosis not present

## 2021-01-02 DIAGNOSIS — E1165 Type 2 diabetes mellitus with hyperglycemia: Secondary | ICD-10-CM | POA: Diagnosis not present

## 2021-01-02 DIAGNOSIS — K219 Gastro-esophageal reflux disease without esophagitis: Secondary | ICD-10-CM | POA: Diagnosis not present

## 2021-02-02 DIAGNOSIS — M5459 Other low back pain: Secondary | ICD-10-CM | POA: Diagnosis not present

## 2021-02-02 DIAGNOSIS — E1165 Type 2 diabetes mellitus with hyperglycemia: Secondary | ICD-10-CM | POA: Diagnosis not present

## 2021-02-02 DIAGNOSIS — E7849 Other hyperlipidemia: Secondary | ICD-10-CM | POA: Diagnosis not present

## 2021-02-02 DIAGNOSIS — K219 Gastro-esophageal reflux disease without esophagitis: Secondary | ICD-10-CM | POA: Diagnosis not present

## 2021-02-02 DIAGNOSIS — E559 Vitamin D deficiency, unspecified: Secondary | ICD-10-CM | POA: Diagnosis not present

## 2021-02-02 DIAGNOSIS — G894 Chronic pain syndrome: Secondary | ICD-10-CM | POA: Diagnosis not present

## 2021-04-02 DIAGNOSIS — K219 Gastro-esophageal reflux disease without esophagitis: Secondary | ICD-10-CM | POA: Diagnosis not present

## 2021-04-02 DIAGNOSIS — M2559 Pain in other specified joint: Secondary | ICD-10-CM | POA: Diagnosis not present

## 2021-04-02 DIAGNOSIS — E7849 Other hyperlipidemia: Secondary | ICD-10-CM | POA: Diagnosis not present

## 2021-05-30 DIAGNOSIS — M5459 Other low back pain: Secondary | ICD-10-CM | POA: Diagnosis not present

## 2021-05-30 DIAGNOSIS — G894 Chronic pain syndrome: Secondary | ICD-10-CM | POA: Diagnosis not present

## 2021-05-30 DIAGNOSIS — E1165 Type 2 diabetes mellitus with hyperglycemia: Secondary | ICD-10-CM | POA: Diagnosis not present

## 2021-06-29 DIAGNOSIS — E1165 Type 2 diabetes mellitus with hyperglycemia: Secondary | ICD-10-CM | POA: Diagnosis not present

## 2021-06-29 DIAGNOSIS — M5459 Other low back pain: Secondary | ICD-10-CM | POA: Diagnosis not present

## 2021-06-29 DIAGNOSIS — M2559 Pain in other specified joint: Secondary | ICD-10-CM | POA: Diagnosis not present

## 2021-08-08 ENCOUNTER — Telehealth: Payer: Self-pay

## 2021-08-08 NOTE — Telephone Encounter (Signed)
No to new pt - per MG - talked with patient

## 2021-09-11 DIAGNOSIS — E119 Type 2 diabetes mellitus without complications: Secondary | ICD-10-CM | POA: Diagnosis not present

## 2021-09-11 DIAGNOSIS — K219 Gastro-esophageal reflux disease without esophagitis: Secondary | ICD-10-CM | POA: Diagnosis not present

## 2021-09-11 DIAGNOSIS — M5441 Lumbago with sciatica, right side: Secondary | ICD-10-CM | POA: Diagnosis not present

## 2021-09-11 DIAGNOSIS — E785 Hyperlipidemia, unspecified: Secondary | ICD-10-CM | POA: Diagnosis not present

## 2021-09-11 DIAGNOSIS — I10 Essential (primary) hypertension: Secondary | ICD-10-CM | POA: Diagnosis not present

## 2021-10-19 DIAGNOSIS — E119 Type 2 diabetes mellitus without complications: Secondary | ICD-10-CM | POA: Diagnosis not present

## 2021-10-19 DIAGNOSIS — M5441 Lumbago with sciatica, right side: Secondary | ICD-10-CM | POA: Diagnosis not present

## 2021-10-19 DIAGNOSIS — E785 Hyperlipidemia, unspecified: Secondary | ICD-10-CM | POA: Diagnosis not present

## 2021-10-19 DIAGNOSIS — I10 Essential (primary) hypertension: Secondary | ICD-10-CM | POA: Diagnosis not present

## 2021-12-01 IMAGING — DX DG LUMBAR SPINE COMPLETE 4+V
5 series · 5 of 5 positions shown · non-contrast
Comparison: None

CLINICAL DATA: Right leg numbness. Low back pain.

EXAM:
LUMBAR SPINE - COMPLETE 4+ VIEW

[l-spine ap]
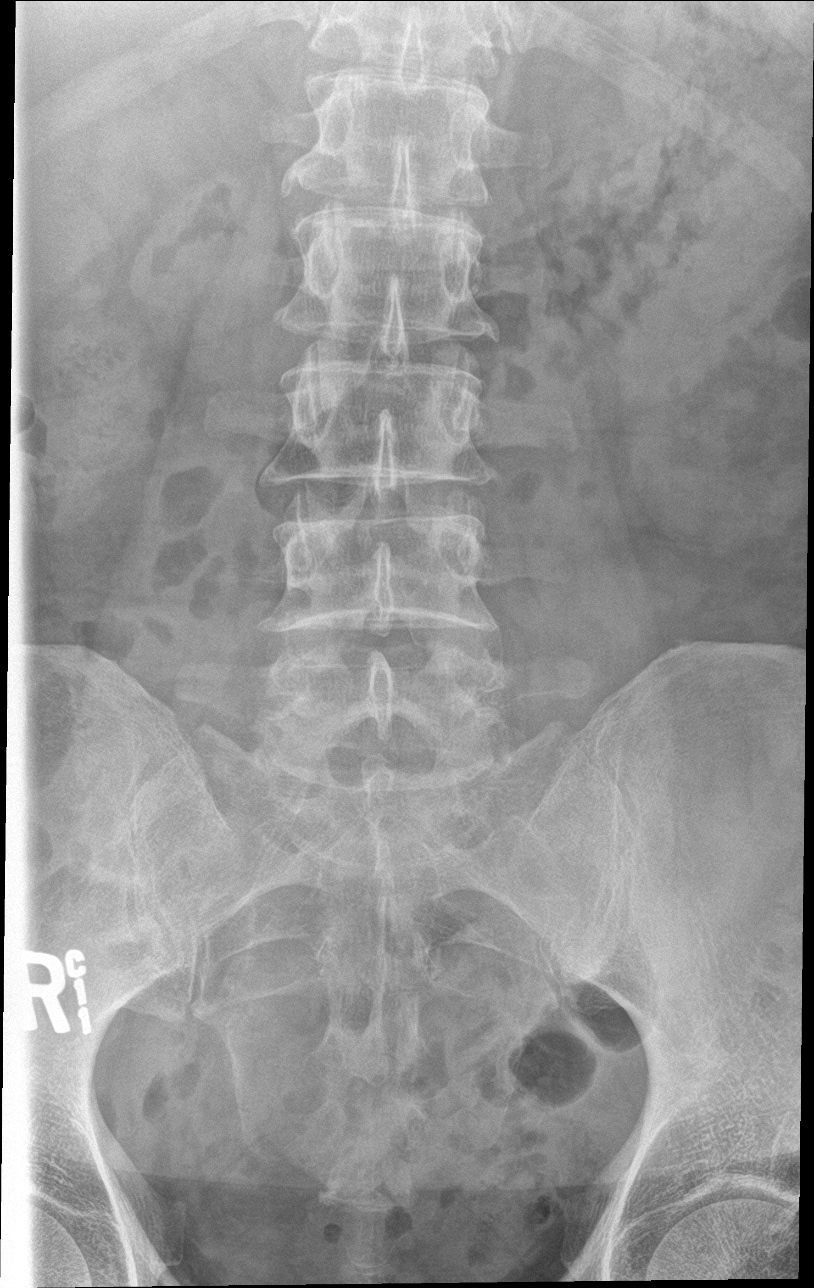

[l-spine obl (1 of 2)]
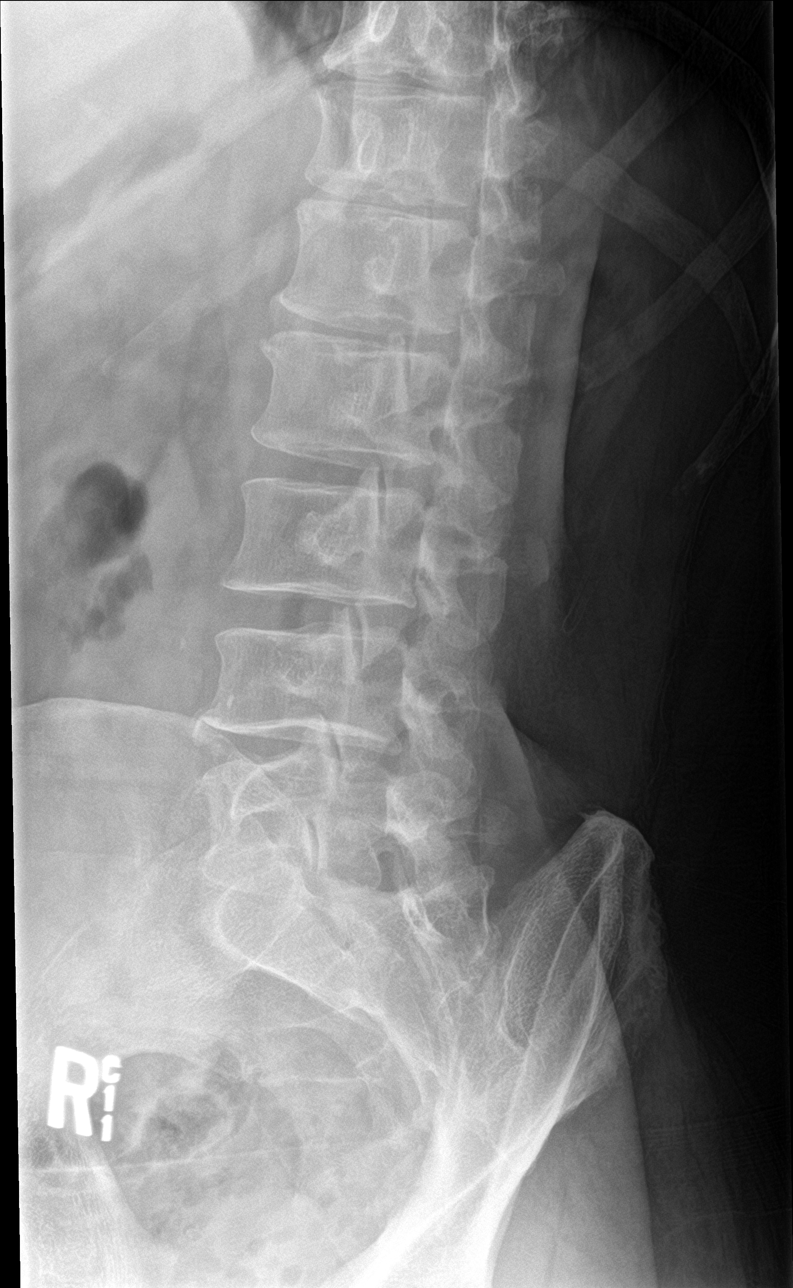

[l-spine obl (2 of 2)]
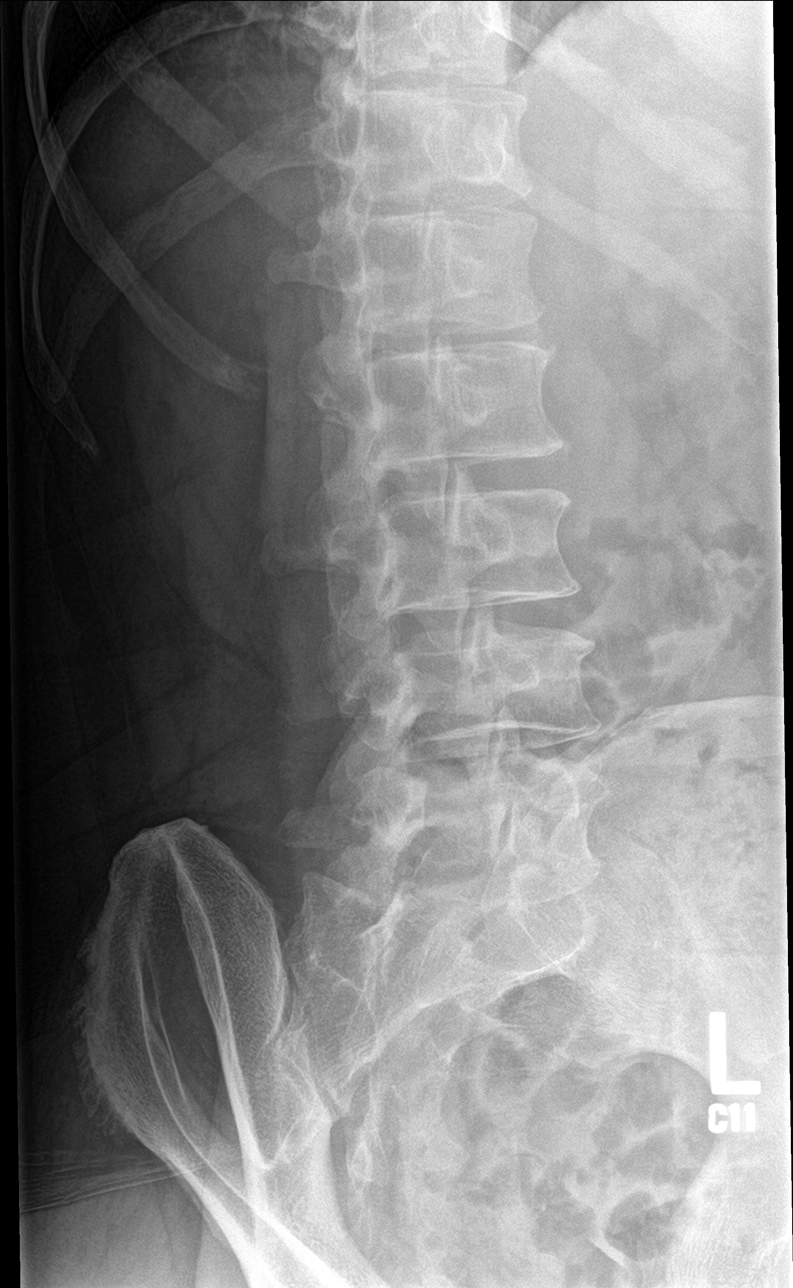

[l-spine lat]
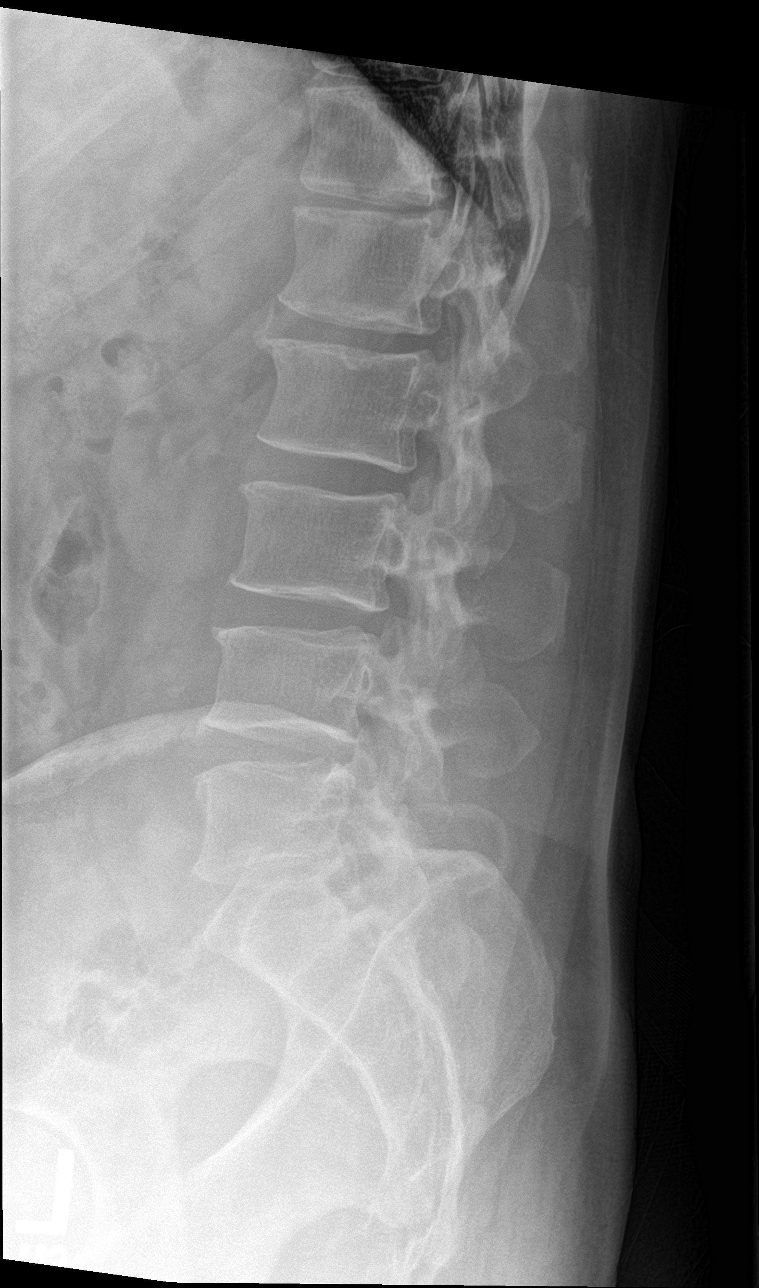

[l-spine spot]
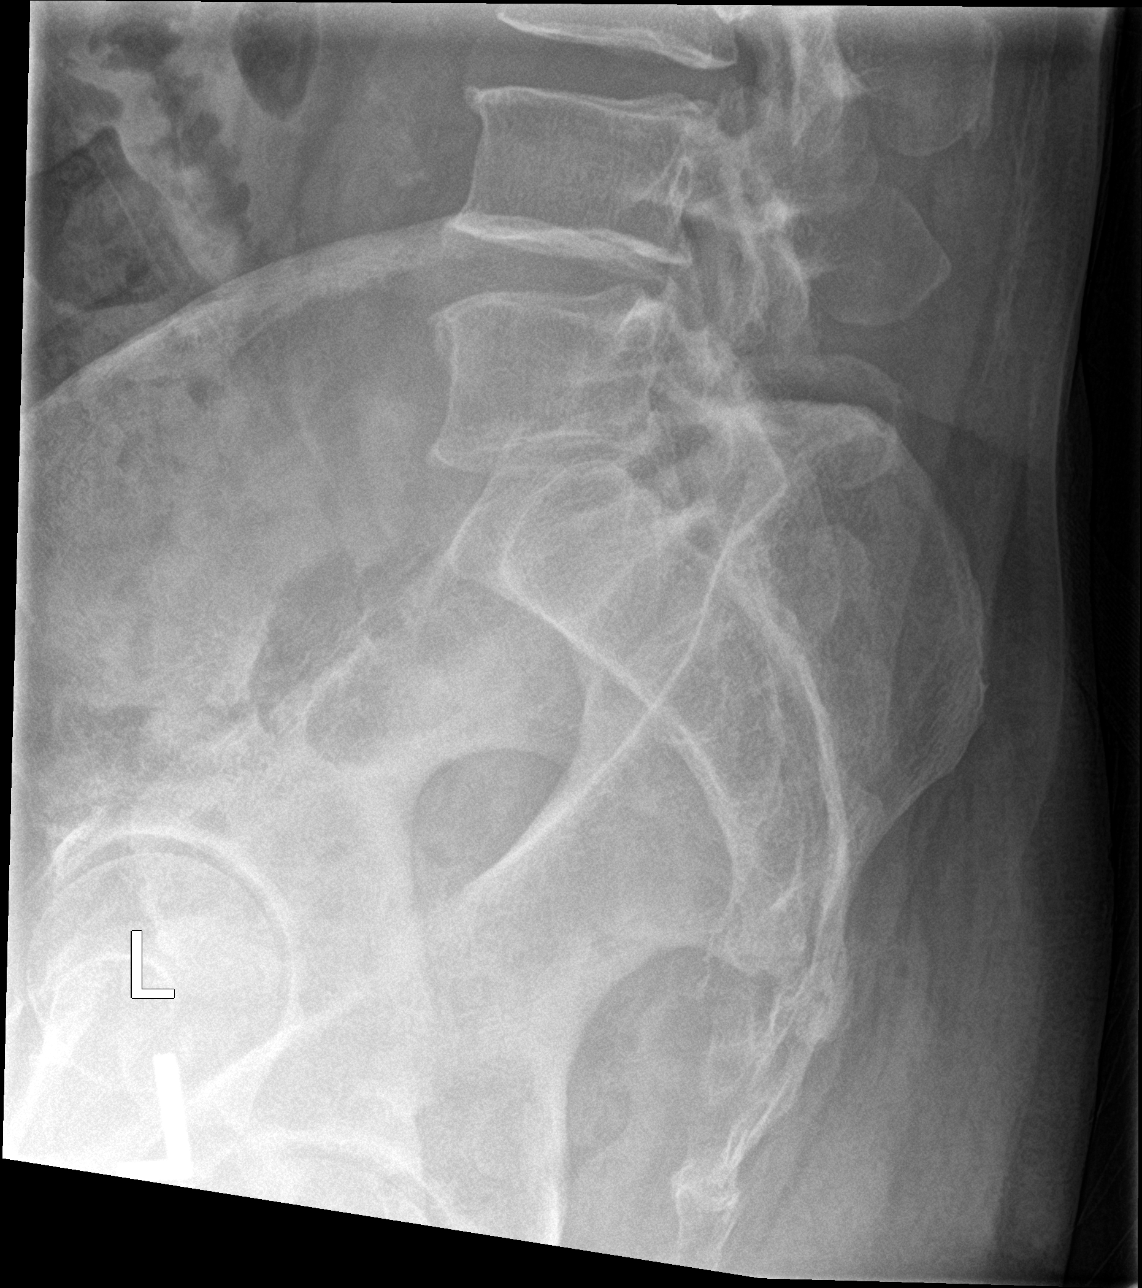

[5 of 5 positions shown; findings below may reference images not displayed]

FINDINGS: The alignment of the lumbar spine appears normal. The vertebral body
heights are well preserved. Mild multi level disc space narrowing
and endplate spurring is identified throughout the lumbar spine.
IMPRESSION: Mild lumbar degenerative disc disease.

## 2021-12-05 DIAGNOSIS — I10 Essential (primary) hypertension: Secondary | ICD-10-CM | POA: Diagnosis not present

## 2021-12-05 DIAGNOSIS — M5441 Lumbago with sciatica, right side: Secondary | ICD-10-CM | POA: Diagnosis not present

## 2021-12-05 DIAGNOSIS — K219 Gastro-esophageal reflux disease without esophagitis: Secondary | ICD-10-CM | POA: Diagnosis not present

## 2021-12-05 DIAGNOSIS — E119 Type 2 diabetes mellitus without complications: Secondary | ICD-10-CM | POA: Diagnosis not present

## 2021-12-05 DIAGNOSIS — E785 Hyperlipidemia, unspecified: Secondary | ICD-10-CM | POA: Diagnosis not present

## 2022-03-05 ENCOUNTER — Ambulatory Visit: Payer: PPO | Admitting: Internal Medicine
